# Patient Record
Sex: Male | Born: 2000 | Race: Black or African American | Hispanic: No | Marital: Single | Smoking: Never smoker
Health system: Southern US, Community
[De-identification: ages and names within clinical notes are randomized; demographics above are authoritative.]

## PROBLEM LIST (undated history)

## (undated) HISTORY — PX: ADENOIDECTOMY: SUR15

## (undated) HISTORY — PX: TONSILLECTOMY: SUR1361

---
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
## 2019-03-13 IMAGING — CR DG CHEST 2V
1 series · 2 of 2 positions shown · non-contrast
Comparison: None.

CLINICAL DATA: 15-year-old presenting with three-day history of
tachycardia and acute onset of burning chest pain with inspiration
that began today.

EXAM:
CHEST  2 VIEW

[Series 1: w chest pa · 0.14mm/px · 2 of 2 slices shown]
[im 1/2]
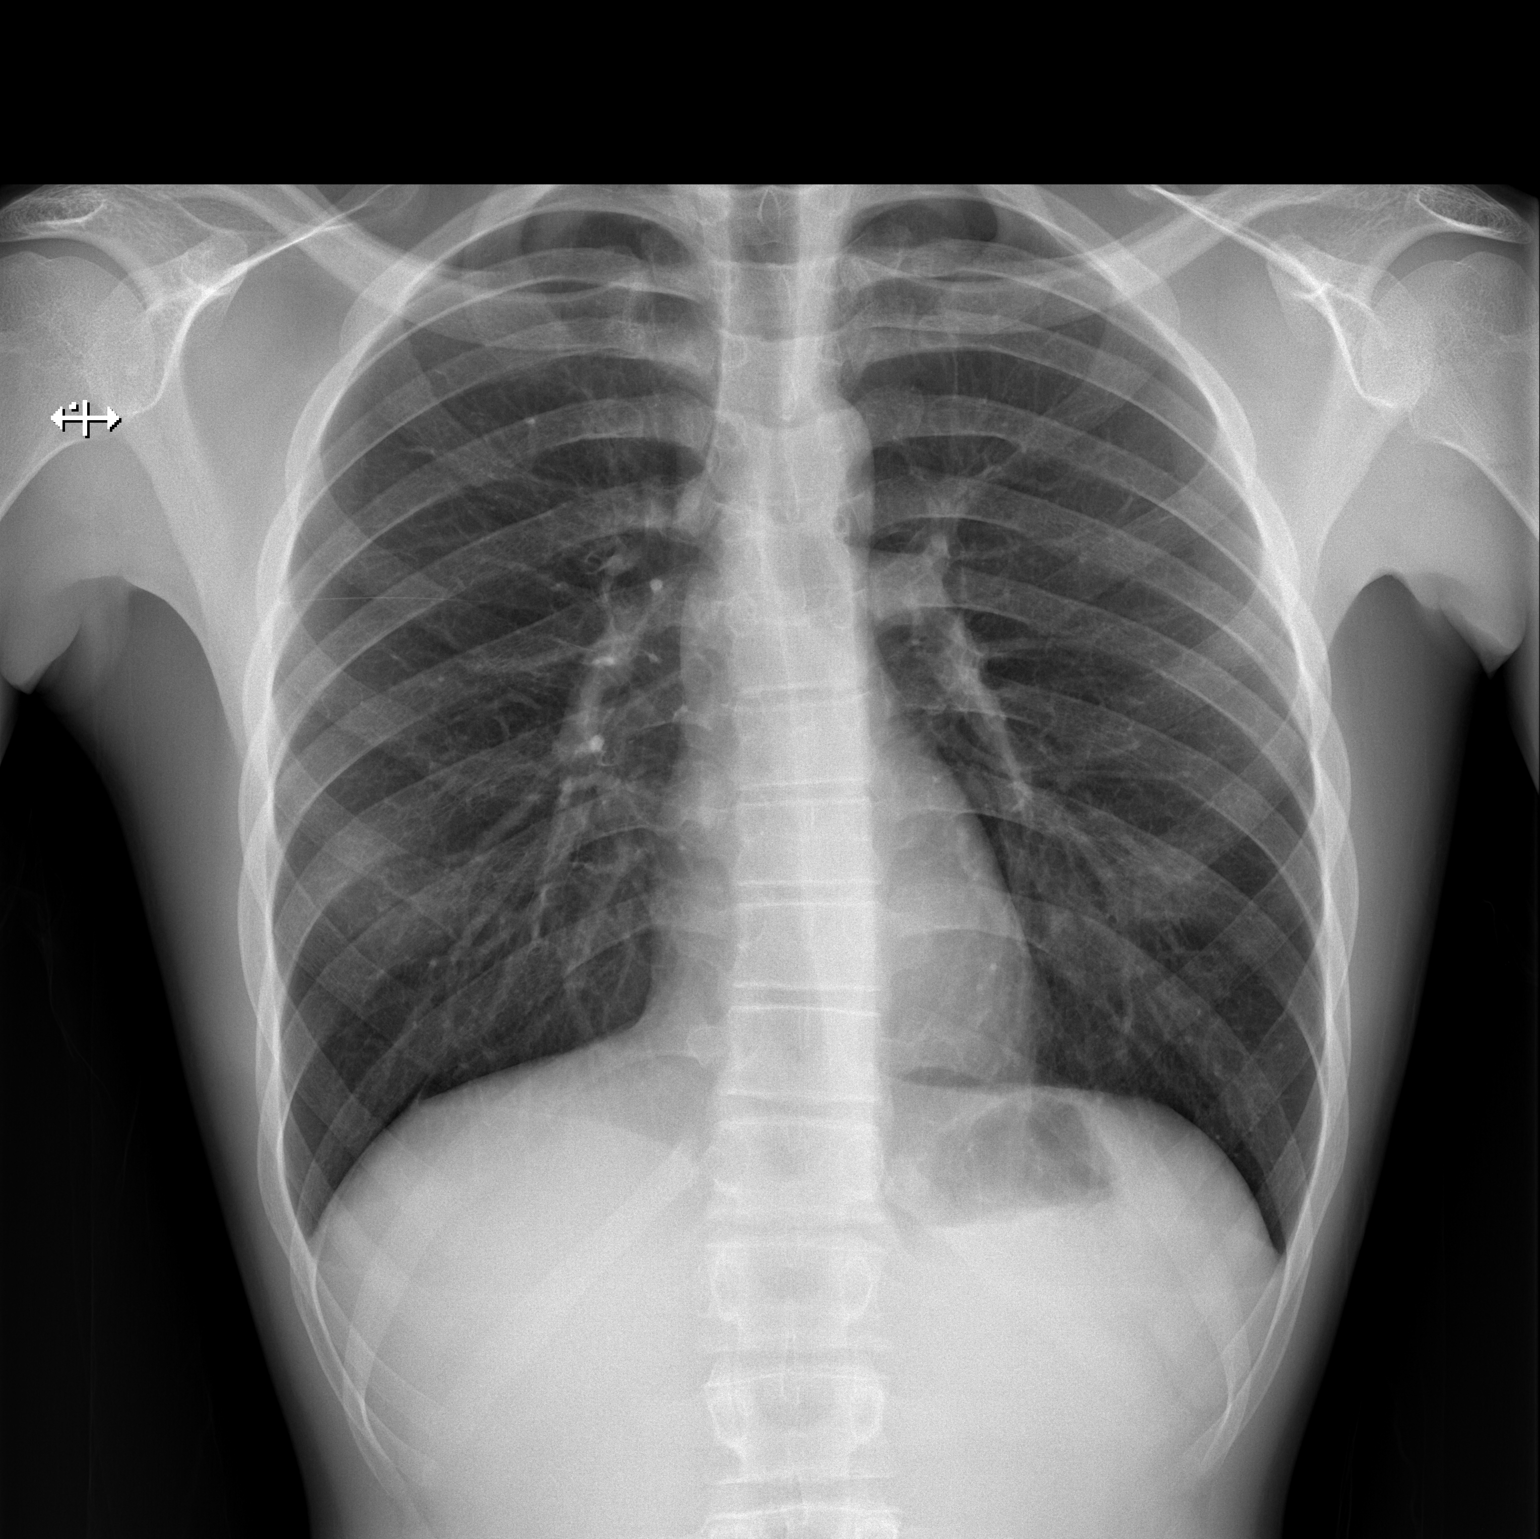
[im 2/2]
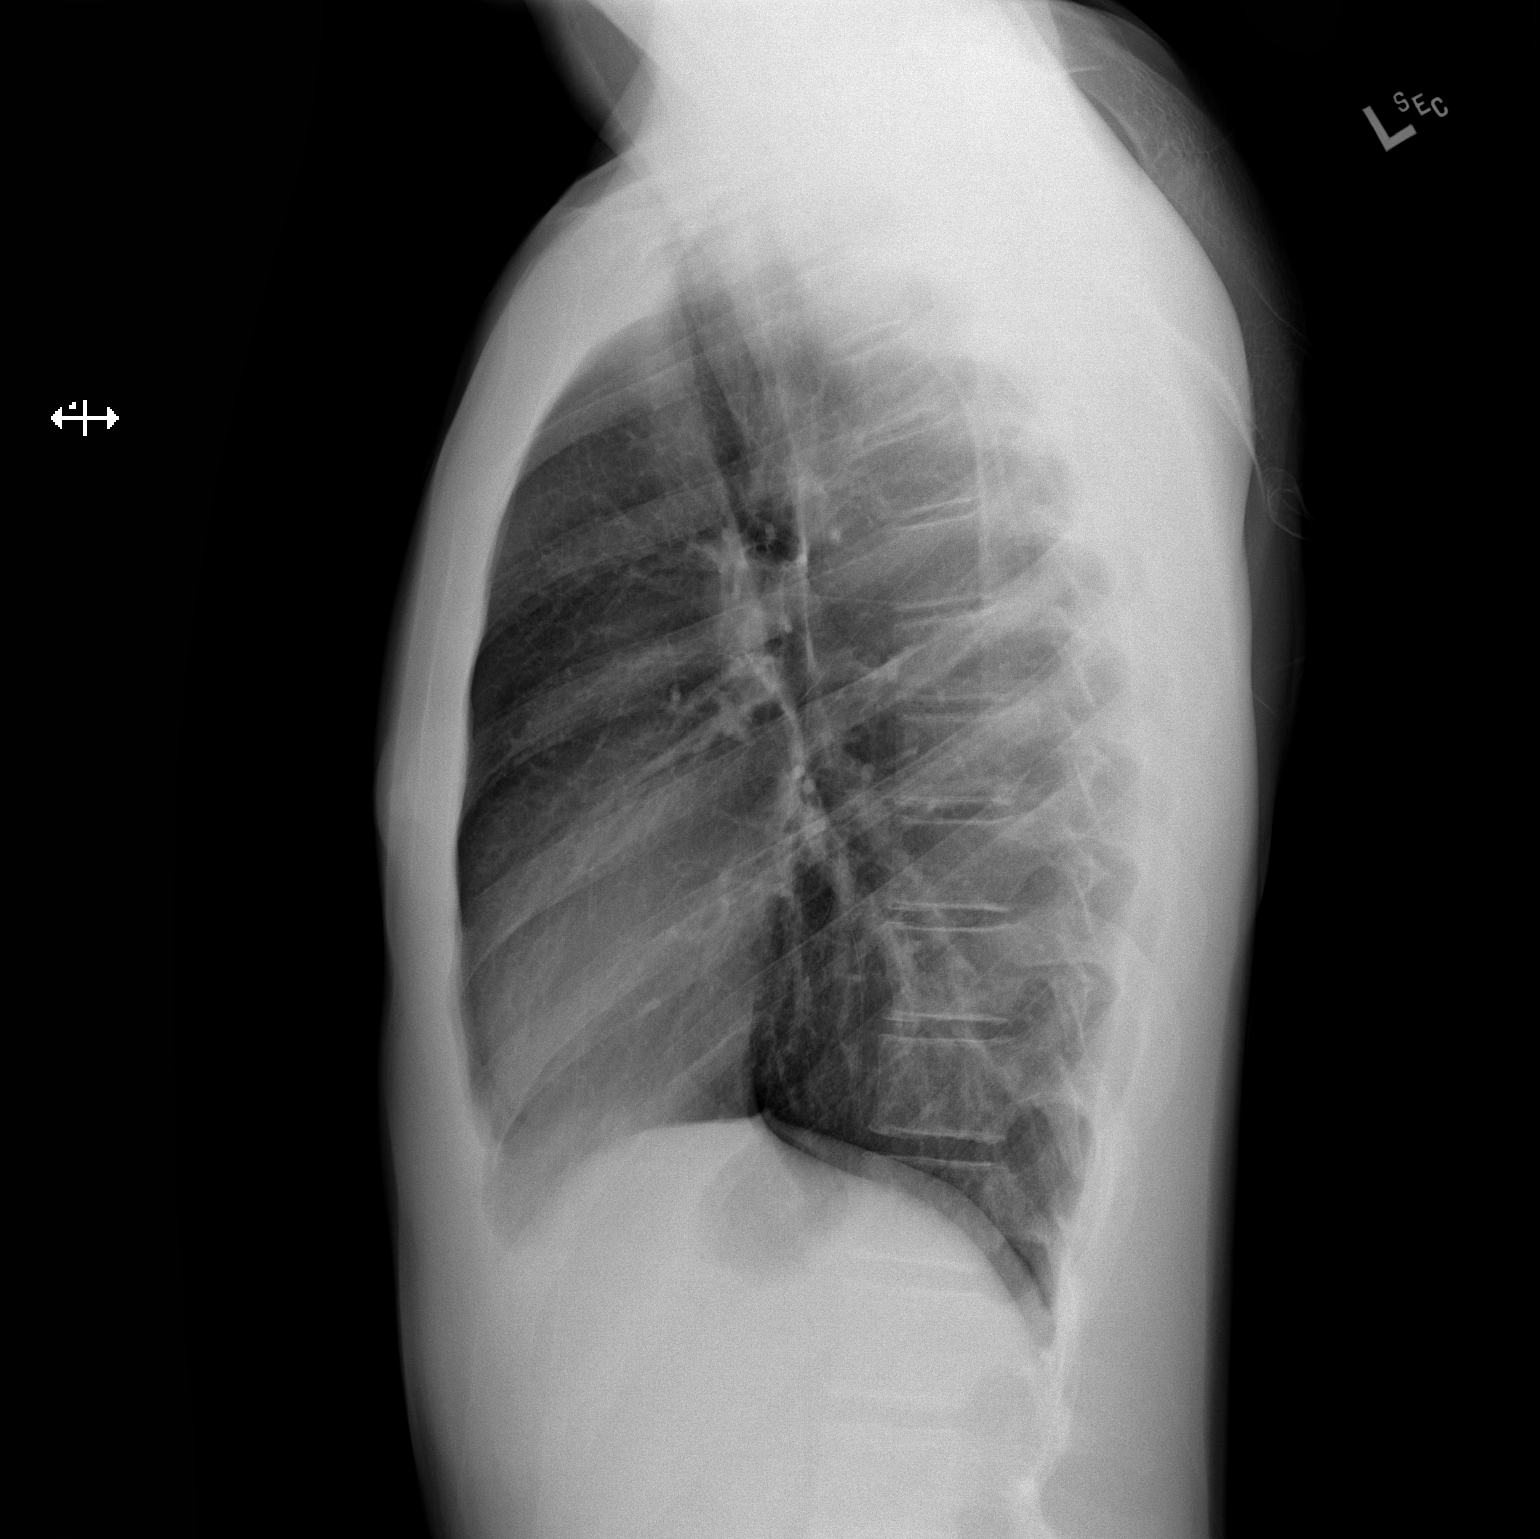

[2 of 2 positions shown; findings below may reference images not displayed]

FINDINGS: Cardiomediastinal silhouette normal in appearance for age. Lungs
clear. Normal lung volumes. Bronchovascular markings normal. No
pleural effusions. Visualized bony thorax intact.
IMPRESSION: Normal examination.

## 2019-04-21 ENCOUNTER — Ambulatory Visit
Admission: EM | Admit: 2019-04-21 | Discharge: 2019-04-21 | Disposition: A | Discharge status: Home / Self Care | Payer: Medicaid Other | Attending: Orthopedic Surgery | Admitting: Orthopedic Surgery

## 2019-04-21 ENCOUNTER — Other Ambulatory Visit: Payer: Self-pay

## 2019-04-21 DIAGNOSIS — Z8249 Family history of ischemic heart disease and other diseases of the circulatory system: Secondary | ICD-10-CM | POA: Diagnosis not present

## 2019-04-21 DIAGNOSIS — R509 Fever, unspecified: Secondary | ICD-10-CM | POA: Diagnosis not present

## 2019-04-21 DIAGNOSIS — J0100 Acute maxillary sinusitis, unspecified: Secondary | ICD-10-CM | POA: Diagnosis not present

## 2019-04-21 DIAGNOSIS — R300 Dysuria: Secondary | ICD-10-CM | POA: Insufficient documentation

## 2019-04-21 DIAGNOSIS — Z20822 Contact with and (suspected) exposure to covid-19: Secondary | ICD-10-CM | POA: Insufficient documentation

## 2019-04-21 DIAGNOSIS — J020 Streptococcal pharyngitis: Secondary | ICD-10-CM | POA: Insufficient documentation

## 2019-04-21 LAB — URINALYSIS, COMPLETE (UACMP) WITH MICROSCOPIC
Glucose, UA: NEGATIVE mg/dL
Hgb urine dipstick: NEGATIVE
Ketones, ur: 40 mg/dL — AB
Leukocytes,Ua: NEGATIVE
Nitrite: NEGATIVE
Protein, ur: 30 mg/dL — AB
Specific Gravity, Urine: 1.02 (ref 1.005–1.030)
pH: 6 (ref 5.0–8.0)

## 2019-04-21 LAB — COMPREHENSIVE METABOLIC PANEL
ALT: 11 U/L (ref 0–44)
AST: 18 U/L (ref 15–41)
Albumin: 4.6 g/dL (ref 3.5–5.0)
Alkaline Phosphatase: 78 U/L (ref 38–126)
Anion gap: 11 (ref 5–15)
BUN: 11 mg/dL (ref 6–20)
CO2: 27 mmol/L (ref 22–32)
Calcium: 9.2 mg/dL (ref 8.9–10.3)
Chloride: 98 mmol/L (ref 98–111)
Creatinine, Ser: 1.14 mg/dL (ref 0.61–1.24)
GFR calc Af Amer: >60 mL/min (ref >60)
GFR calc non Af Amer: >60 mL/min (ref >60)
Glucose, Bld: 90 mg/dL (ref 70–99)
Potassium: 4.1 mmol/L (ref 3.5–5.1)
Sodium: 136 mmol/L (ref 135–145)
Total Bilirubin: 0.7 mg/dL (ref 0.3–1.2)
Total Protein: 8.3 g/dL — ABNORMAL HIGH (ref 6.5–8.1)

## 2019-04-21 LAB — CBC
HCT: 42.9 % (ref 39.0–52.0)
Hemoglobin: 15 g/dL (ref 13.0–17.0)
MCH: 30.5 pg (ref 26.0–34.0)
MCHC: 35 g/dL (ref 30.0–36.0)
MCV: 87.2 fL (ref 80.0–100.0)
Platelets: 209 10*3/uL (ref 150–400)
RBC: 4.92 MIL/uL (ref 4.22–5.81)
RDW: 12.3 % (ref 11.5–15.5)
WBC: 8.2 10*3/uL (ref 4.0–10.5)
nRBC: 0 % (ref 0.0–0.2)

## 2019-04-21 LAB — CHLAMYDIA/NGC RT PCR (ARMC ONLY): N gonorrhoeae: NOT DETECTED

## 2019-04-21 LAB — GROUP A STREP BY PCR: Group A Strep by PCR: NOT DETECTED

## 2019-04-21 LAB — CHLAMYDIA/NGC RT PCR (ARMC ONLY)??????????: Chlamydia Tr: NOT DETECTED

## 2019-04-21 LAB — SARS CORONAVIRUS 2 AG (30 MIN TAT): SARS Coronavirus 2 Ag: NEGATIVE

## 2019-04-21 MED ORDER — ACETAMINOPHEN 500 MG PO TABS
1000.0000 mg | ORAL_TABLET | Freq: Once | ORAL | Status: AC
  Administered 2019-04-21: 1000 mg via ORAL

## 2019-04-21 MED ORDER — IBUPROFEN 800 MG PO TABS: 800.0000 mg | ORAL_TABLET | Freq: Once | ORAL | Status: DC

## 2019-04-21 MED ORDER — ACETAMINOPHEN 500 MG PO TABS: 500.0000 mg | ORAL_TABLET | Freq: Once | ORAL | Status: DC

## 2019-04-21 MED ORDER — AMOXICILLIN-POT CLAVULANATE 875-125 MG PO TABS: 1.0000 | ORAL_TABLET | Freq: Two times a day (BID) | ORAL | 0 refills | Status: AC

## 2019-04-21 MED ORDER — ACETAMINOPHEN 500 MG PO TABS: 1000.0000 mg | ORAL_TABLET | Freq: Once | ORAL | Status: DC

## 2019-04-21 MED ORDER — IBUPROFEN 800 MG PO TABS
800.0000 mg | ORAL_TABLET | Freq: Once | ORAL | Status: AC
  Administered 2019-04-21: 16:00:00 800 mg via ORAL

## 2019-04-21 NOTE — ED Provider Notes (Signed)
[facility]    CSN: [id number] Arrival date & time: 04/21/19  1339      History   Chief Complaint Chief Complaint  Patient presents with  . Sinus Problem  . Dysuria    HPI Walter Beard is a 19 y.o. male presents to the urgent care facility for evaluation of sinus infection, sinus pain, pressure drainage and sore throat.  Sinus pain and pressure is been present for greater than 1 week.  Also having a fever.  Also notes some dysuria without abdominal pain nausea or vomiting.  No recent sexual activity, denies any penile discharge, scrotal discomfort.  No rashes.  No neck pain.  He has not take anything for his temperature.  He is tolerating p.o. well, drinking lots of fluids.  HPI  History reviewed. No pertinent past medical history.  There are no problems to display for this patient.   Past Surgical History:  Procedure Laterality Date  . ADENOIDECTOMY    . TONSILLECTOMY         Home Medications    Prior to Admission medications   Medication Sig Start Date End Date Taking? Authorizing Provider  amoxicillin-clavulanate (AUGMENTIN) 875-125 MG tablet Take 1 tablet by mouth every 12 (twelve) hours. 04/21/19   Evon Slack, PA-C    Family History Family History  Problem Relation Age of Onset  . Healthy Mother   . Hypertension Father     Social History Social History   Tobacco Use  . Smoking status: Never Smoker  . Smokeless tobacco: Never Used  Substance Use Topics  . Alcohol use: Never  . Drug use: Never     Allergies   Patient has no known allergies.   Review of Systems Review of Systems  Constitutional: Positive for chills and fever.  HENT: Positive for congestion, sinus pressure, sinus pain and sore throat. Negative for trouble swallowing and voice change.   Respiratory: Negative for cough and shortness of breath.   Gastrointestinal: Negative for abdominal pain, nausea and vomiting.  Genitourinary: Positive for dysuria. Negative  for difficulty urinating, discharge, flank pain, frequency, hematuria, penile pain, penile swelling and urgency.  Skin: Negative for rash.  Neurological: Negative for headaches.     Physical Exam Triage Vital Signs ED Triage Vitals  Enc Vitals Group     BP 04/21/19 1412 121/87     Pulse Rate 04/21/19 1412 (!) 114     Resp 04/21/19 1412 18     Temp 04/21/19 1412 (!) 102.9 F (39.4 C)     Temp Source 04/21/19 1412 Oral     SpO2 04/21/19 1412 99 %     Weight 04/21/19 1410 145 lb (65.8 kg)     Height 04/21/19 1410 5\' 9"  (1.753 m)     Head Circumference --      Peak Flow --      Pain Score 04/21/19 1410 9     Pain Loc --      Pain Edu? --      Excl. in GC? --    No data found.  Updated Vital Signs BP 120/85 (BP Location: Left Arm)   Pulse 90   Temp 98.8 F (37.1 C) (Oral)   Resp 18   Ht 5\' 9"  (1.753 m)   Wt 145 lb (65.8 kg)   SpO2 100%   BMI 21.41 kg/m   Visual Acuity Right Eye Distance:   Left Eye Distance:   Bilateral Distance:    Right Eye Near:  Left Eye Near:    Bilateral Near:     Physical Exam Constitutional:      Appearance: Normal appearance. He is well-developed.  HENT:     Head: Normocephalic and atraumatic.     Comments: Positive frontal maxillary sinus tenderness.    Right Ear: External ear normal.     Left Ear: External ear normal.     Nose: Nose normal.     Mouth/Throat:     Pharynx: No oropharyngeal exudate or posterior oropharyngeal erythema.  Eyes:     General:        Right eye: No discharge.        Left eye: No discharge.     Conjunctiva/sclera: Conjunctivae normal.  Cardiovascular:     Rate and Rhythm: Normal rate.  Pulmonary:     Effort: Pulmonary effort is normal. No respiratory distress.     Breath sounds: No stridor. No wheezing.  Abdominal:     General: Abdomen is flat. There is no distension.     Palpations: There is no mass.     Tenderness: There is no abdominal tenderness. There is no guarding or rebound.    Musculoskeletal:        General: Normal range of motion.     Cervical back: Normal range of motion.  Lymphadenopathy:     Cervical: No cervical adenopathy.  Skin:    General: Skin is warm.     Findings: No rash.  Neurological:     General: No focal deficit present.     Mental Status: He is alert and oriented to person, place, and time.  Psychiatric:        Behavior: Behavior normal.      UC Treatments / Results  Labs (all labs ordered are listed, but only abnormal results are displayed) Labs Reviewed  URINALYSIS, COMPLETE (UACMP) WITH MICROSCOPIC - Abnormal; Notable for the following components:      Result Value   Bilirubin Urine SMALL (*)    Ketones, ur 40 (*)    Protein, ur 30 (*)    Bacteria, UA FEW (*)    All other components within normal limits  COMPREHENSIVE METABOLIC PANEL - Abnormal; Notable for the following components:   Total Protein 8.3 (*)    All other components within normal limits  SARS CORONAVIRUS 2 AG (30 MIN TAT)  GROUP A STREP BY PCR  CHLAMYDIA/NGC RT PCR ([facility] ONLY)  URINE CULTURE  CBC    EKG   Radiology No results found.  Procedures Procedures (including critical care time)  Medications Ordered in UC Medications - No data to display  Initial Impression / Assessment and Plan / UC Course  I have reviewed the triage vital signs and the nursing notes.  Pertinent labs & imaging results that were available during my care of the patient were reviewed by me and considered in my medical decision making (see chart for details).     19 year old male with fever, sore throat, sinus pain, congestion.  Also noted to have some dysuria.  History not concerning for STD.  Urinalysis negative.  Urine culture pending.  STD test pending.  He is negative for Covid and strep.  Tolerating p.o. well.  Afebrile nontachycardic with normal vital signs after Tylenol.  His lungs are clear to auscultation.  He is diagnosed with sinusitis and started on  Augmentin.  He is encouraged to increase fluids.  He understands signs and symptoms return to the ER for such as any difficulty swallowing, worsening pain,  fevers above 101, abdominal pain, nausea vomiting or increased dysuria. Final Clinical Impressions(s) / UC Diagnoses   Final diagnoses:  Dysuria  Streptococcal sore throat  Acute non-recurrent maxillary sinusitis  Fever, unspecified     Discharge Instructions     Please make sure you are drinking lots of fluids.  Alternate Tylenol and ibuprofen as needed for pain or fevers.  Take antibiotics as prescribed.  Return to the ER for any difficulty swallowing, worsening pain, fevers above 101 that are not going down with Tylenol or ibuprofen or urgent changes in your health.   ED Prescriptions    Medication Sig Dispense Auth. Provider   amoxicillin-clavulanate (AUGMENTIN) 875-125 MG tablet Take 1 tablet by mouth every 12 (twelve) hours. 14 tablet Evon Slack, [place]     PDMP not reviewed this encounter.   Evon Slack, [place] 04/21/19 1603

## 2019-04-21 NOTE — ED Triage Notes (Signed)
Pt presents with c/o possible sinus infection for several days. Pt reports sinus drainage with some blood along with some pressure behind his nose. Pt states he also has some throat irritation. Pt also reports dysuria for 4 days. Pt denies hematuria, abd pain, n/v/d. Pt does report fever (101) Thursday night. Pt states it self-resolved. Pt denies any known sick contacts.

## 2019-04-21 NOTE — Discharge Instructions (Addendum)
Please make sure you are drinking lots of fluids.  Alternate Tylenol and ibuprofen as needed for pain or fevers.  Take antibiotics as prescribed.  Return to the ER for any difficulty swallowing, worsening pain, fevers above 101 that are not going down with Tylenol or ibuprofen or urgent changes in your health.

## 2019-04-23 LAB — URINE CULTURE: Culture: NO GROWTH
